# Patient Record
Sex: Male | Born: 1995 | Race: White | Hispanic: No | Marital: Single | Smoking: Never smoker
Health system: Southern US, Community
[De-identification: ages and names within clinical notes are randomized; demographics above are authoritative.]

---
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
## 2021-01-20 ENCOUNTER — Emergency Department: Admission: EM | Admit: 2021-01-20 | Discharge: 2021-01-20 | Discharge status: Against Medical Advice | Payer: Self-pay

## 2021-01-21 ENCOUNTER — Emergency Department (HOSPITAL_COMMUNITY): Payer: Self-pay

## 2021-01-21 ENCOUNTER — Emergency Department (HOSPITAL_COMMUNITY)
Admission: EM | Admit: 2021-01-21 | Discharge: 2021-01-21 | Disposition: A | Discharge status: Home / Self Care | Payer: Self-pay | Attending: Emergency Medicine | Admitting: Emergency Medicine

## 2021-01-21 ENCOUNTER — Other Ambulatory Visit: Payer: Self-pay

## 2021-01-21 DIAGNOSIS — R52 Pain, unspecified: Secondary | ICD-10-CM

## 2021-01-21 DIAGNOSIS — R109 Unspecified abdominal pain: Secondary | ICD-10-CM | POA: Insufficient documentation

## 2021-01-21 DIAGNOSIS — S2241XA Multiple fractures of ribs, right side, initial encounter for closed fracture: Secondary | ICD-10-CM | POA: Insufficient documentation

## 2021-01-21 DIAGNOSIS — M546 Pain in thoracic spine: Secondary | ICD-10-CM | POA: Insufficient documentation

## 2021-01-21 DIAGNOSIS — R0789 Other chest pain: Secondary | ICD-10-CM

## 2021-01-21 DIAGNOSIS — Y92410 Unspecified street and highway as the place of occurrence of the external cause: Secondary | ICD-10-CM | POA: Insufficient documentation

## 2021-01-21 LAB — CBC WITH DIFFERENTIAL/PLATELET
Abs Immature Granulocytes: 0.03 10*3/uL (ref 0.00–0.07)
Basophils Absolute: 0.1 10*3/uL (ref 0.0–0.1)
Basophils Relative: 1 %
Eosinophils Absolute: 0.1 10*3/uL (ref 0.0–0.5)
Eosinophils Relative: 2 %
HCT: 46.1 % (ref 39.0–52.0)
Hemoglobin: 15.5 g/dL (ref 13.0–17.0)
Immature Granulocytes: 0 %
Lymphocytes Relative: 16 %
Lymphs Abs: 1.4 10*3/uL (ref 0.7–4.0)
MCH: 31.3 pg (ref 26.0–34.0)
MCHC: 33.6 g/dL (ref 30.0–36.0)
MCV: 93.1 fL (ref 80.0–100.0)
Monocytes Absolute: 0.6 10*3/uL (ref 0.1–1.0)
Monocytes Relative: 7 %
Neutro Abs: 6.4 10*3/uL (ref 1.7–7.7)
Neutrophils Relative %: 74 %
Platelets: 227 10*3/uL (ref 150–400)
RBC: 4.95 MIL/uL (ref 4.22–5.81)
RDW: 11.9 % (ref 11.5–15.5)
WBC: 8.6 10*3/uL (ref 4.0–10.5)
nRBC: 0 % (ref 0.0–0.2)

## 2021-01-21 LAB — COMPREHENSIVE METABOLIC PANEL
ALT: 21 U/L (ref 0–44)
AST: 22 U/L (ref 15–41)
Albumin: 3.8 g/dL (ref 3.5–5.0)
Alkaline Phosphatase: 68 U/L (ref 38–126)
Anion gap: 10 (ref 5–15)
BUN: 8 mg/dL (ref 6–20)
CO2: 27 mmol/L (ref 22–32)
Calcium: 9.2 mg/dL (ref 8.9–10.3)
Chloride: 101 mmol/L (ref 98–111)
Creatinine, Ser: 0.83 mg/dL (ref 0.61–1.24)
GFR, Estimated: >60 mL/min (ref >60)
Glucose, Bld: 96 mg/dL (ref 70–99)
Potassium: 4.2 mmol/L (ref 3.5–5.1)
Sodium: 138 mmol/L (ref 135–145)
Total Bilirubin: 1.2 mg/dL (ref 0.3–1.2)
Total Protein: 6.7 g/dL (ref 6.5–8.1)

## 2021-01-21 MED ORDER — HYDROCODONE-ACETAMINOPHEN 5-325 MG PO TABS: 1.0000 | ORAL_TABLET | ORAL | 0 refills | Status: AC | PRN

## 2021-01-21 MED ORDER — METHOCARBAMOL 500 MG PO TABS: 500.0000 mg | ORAL_TABLET | Freq: Two times a day (BID) | ORAL | 0 refills | Status: AC

## 2021-01-21 MED ORDER — IOHEXOL 350 MG/ML SOLN
70.0000 mL | Freq: Once | INTRAVENOUS | Status: AC | PRN
  Administered 2021-01-21: 70 mL via INTRAVENOUS

## 2021-01-21 NOTE — Discharge Instructions (Addendum)
Use incentive spirometer. Take Norco as needed as prescribed for pain.  Do not drive or operate machinery while taking Norco.  Take Robaxin as needed as prescribed for muscle spasms.  Recheck with your primary care provider, referral provided if you do not have a primary care provider.  Return to the emergency room for worsening or concerning symptoms.
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
## 2021-01-21 NOTE — ED Notes (Signed)
Pt not present when called for vital signs 

## 2021-01-21 NOTE — ED Notes (Signed)
Incentive spirometer provided to pt and educated on use. Pt demonstrated correct use and verbalizes understanding to use 10X QHWA.
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
